# Patient Record
Sex: Female | Born: 1955 | Race: White | Marital: Married | Smoking: Former smoker
Health system: Southern US, Community
[De-identification: ages and names within clinical notes are randomized; demographics above are authoritative.]

## PROBLEM LIST (undated history)

## (undated) DIAGNOSIS — Z9289 Personal history of other medical treatment: Secondary | ICD-10-CM

## (undated) DIAGNOSIS — I447 Left bundle-branch block, unspecified: Secondary | ICD-10-CM

## (undated) DIAGNOSIS — M1990 Unspecified osteoarthritis, unspecified site: Secondary | ICD-10-CM

## (undated) DIAGNOSIS — Z9889 Other specified postprocedural states: Secondary | ICD-10-CM

## (undated) DIAGNOSIS — R112 Nausea with vomiting, unspecified: Secondary | ICD-10-CM

## (undated) DIAGNOSIS — R7303 Prediabetes: Secondary | ICD-10-CM

## (undated) DIAGNOSIS — R9431 Abnormal electrocardiogram [ECG] [EKG]: Secondary | ICD-10-CM

## (undated) HISTORY — PX: ABDOMINAL HYSTERECTOMY: SHX81

## (undated) HISTORY — PX: TUBAL LIGATION: SHX77

## (undated) HISTORY — PX: CARDIAC CATHETERIZATION: SHX172

## (undated) HISTORY — PX: JOINT REPLACEMENT: SHX530

## (undated) HISTORY — PX: DILATION AND CURETTAGE OF UTERUS: SHX78

## (undated) HISTORY — PX: FRACTURE SURGERY: SHX138

---

## 2018-11-26 ENCOUNTER — Other Ambulatory Visit: Payer: Self-pay | Admitting: Orthopedic Surgery

## 2018-11-26 DIAGNOSIS — M19171 Post-traumatic osteoarthritis, right ankle and foot: Secondary | ICD-10-CM

## 2018-11-28 ENCOUNTER — Other Ambulatory Visit: Payer: Self-pay

## 2018-11-28 ENCOUNTER — Ambulatory Visit
Admission: RE | Admit: 2018-11-28 | Discharge: 2018-11-28 | Disposition: A | Discharge status: Home / Self Care | Payer: Medicare Other | Source: Ambulatory Visit | Attending: Orthopedic Surgery | Admitting: Orthopedic Surgery

## 2018-11-28 ENCOUNTER — Other Ambulatory Visit: Payer: Self-pay | Admitting: Orthopedic Surgery

## 2018-11-28 DIAGNOSIS — M19171 Post-traumatic osteoarthritis, right ankle and foot: Secondary | ICD-10-CM

## 2018-12-05 ENCOUNTER — Other Ambulatory Visit: Payer: Self-pay

## 2018-12-23 ENCOUNTER — Other Ambulatory Visit (HOSPITAL_COMMUNITY): Payer: Self-pay | Admitting: Orthopedic Surgery

## 2019-01-08 NOTE — Pre-Procedure Instructions (Signed)
   Vanessa Russo  01/08/2019    [facility] [address] [place], [place] [address] [place] [address] Phone: [phone] Fax: [phone]   Your procedure is scheduled on Thursday, January 15, 2019  Report to [facility] at 5:30 A.M.  Call this number if you have problems the morning of surgery:  [phone]   Remember:  Do not eat  after midnight the night before surgery.  You may drink clear liquids until 4:30 A.M.  Clear liquids allowed are:                    Water, Juice (non-citric and without pulp), Carbonated beverages, Clear Tea, Black Coffee only and Gatorade Please finish your Ensure Pre-Surgery drink by 4:30A.M. the morning of surgery        ( Do not sip).   Take these medicines the morning of surgery with A SIP OF WATER:  carvedilol (COREG) omeprazole (PRILOSEC)  If needed: traMADol (ULTRAM) for pain  Stop taking Aspirin (unless otherwise advised by surgeon), vitamins, fish oil, TURMERIC and herbal medications. Do not take any NSAIDs ie: Ibuprofen, Advil, Naproxen (Aleve), Motrin, Meloxicam (Mobic),  BC and Goody Powder; stop now.   Do not wear jewelry, make-up or nail polish.  Do not wear lotions, powders, or perfumes, or deodorant.  Do not shave 48 hours prior to surgery.    Do not bring valuables to the hospital.  [facility] is not responsible for any belongings or valuables.  Contacts, dentures or bridgework may not be worn into surgery.  Leave your suitcase in the car.  After surgery it may be brought to your room.  For patients admitted to the hospital, discharge time will be determined by your treatment team.  Patients discharged the day of surgery will not be allowed to drive home.  Special instructions: See " [facility] Preparing For Surgery " sheet.  Please read over the following fact sheets that you were given. Pain Booklet, Coughing and Deep Breathing and Surgical Site  Infection Prevention

## 2019-01-12 ENCOUNTER — Encounter (HOSPITAL_COMMUNITY)
Admission: RE | Admit: 2019-01-12 | Discharge: 2019-01-12 | Disposition: A | Discharge status: Home / Self Care | Payer: Medicare Other | Source: Ambulatory Visit | Attending: Orthopedic Surgery | Admitting: Orthopedic Surgery

## 2019-01-12 ENCOUNTER — Other Ambulatory Visit (HOSPITAL_COMMUNITY)
Admission: RE | Admit: 2019-01-12 | Discharge: 2019-01-12 | Disposition: A | Discharge status: Home / Self Care | Payer: Medicare Other | Source: Ambulatory Visit | Attending: Orthopedic Surgery | Admitting: Orthopedic Surgery

## 2019-01-12 ENCOUNTER — Encounter (HOSPITAL_COMMUNITY): Payer: Self-pay

## 2019-01-12 ENCOUNTER — Other Ambulatory Visit: Payer: Self-pay

## 2019-01-12 DIAGNOSIS — T1490XS Injury, unspecified, sequela: Secondary | ICD-10-CM | POA: Insufficient documentation

## 2019-01-12 DIAGNOSIS — Z01818 Encounter for other preprocedural examination: Secondary | ICD-10-CM | POA: Insufficient documentation

## 2019-01-12 DIAGNOSIS — Z96652 Presence of left artificial knee joint: Secondary | ICD-10-CM | POA: Insufficient documentation

## 2019-01-12 DIAGNOSIS — R9431 Abnormal electrocardiogram [ECG] [EKG]: Secondary | ICD-10-CM | POA: Insufficient documentation

## 2019-01-12 DIAGNOSIS — Z79899 Other long term (current) drug therapy: Secondary | ICD-10-CM | POA: Insufficient documentation

## 2019-01-12 DIAGNOSIS — X58XXXS Exposure to other specified factors, sequela: Secondary | ICD-10-CM | POA: Insufficient documentation

## 2019-01-12 DIAGNOSIS — I447 Left bundle-branch block, unspecified: Secondary | ICD-10-CM | POA: Insufficient documentation

## 2019-01-12 DIAGNOSIS — M19171 Post-traumatic osteoarthritis, right ankle and foot: Secondary | ICD-10-CM | POA: Insufficient documentation

## 2019-01-12 DIAGNOSIS — Z87891 Personal history of nicotine dependence: Secondary | ICD-10-CM | POA: Insufficient documentation

## 2019-01-12 DIAGNOSIS — R7303 Prediabetes: Secondary | ICD-10-CM | POA: Insufficient documentation

## 2019-01-12 HISTORY — DX: Left bundle-branch block, unspecified: I44.7

## 2019-01-12 HISTORY — DX: Other specified postprocedural states: Z98.890

## 2019-01-12 HISTORY — DX: Personal history of other medical treatment: Z92.89

## 2019-01-12 HISTORY — DX: Prediabetes: R73.03

## 2019-01-12 HISTORY — DX: Unspecified osteoarthritis, unspecified site: M19.90

## 2019-01-12 HISTORY — DX: Abnormal electrocardiogram (ECG) (EKG): R94.31

## 2019-01-12 HISTORY — DX: Other specified postprocedural states: R11.2

## 2019-01-12 LAB — SURGICAL PCR SCREEN
MRSA, PCR: NEGATIVE
Staphylococcus aureus: NEGATIVE

## 2019-01-12 LAB — CBC
HCT: 44.1 % (ref 36.0–46.0)
Hemoglobin: 14.6 g/dL (ref 12.0–15.0)
MCH: 31.7 pg (ref 26.0–34.0)
MCHC: 33.1 g/dL (ref 30.0–36.0)
MCV: 95.7 fL (ref 80.0–100.0)
Platelets: 319 10*3/uL (ref 150–400)
RBC: 4.61 MIL/uL (ref 3.87–5.11)
RDW: 12.2 % (ref 11.5–15.5)
WBC: 8.7 10*3/uL (ref 4.0–10.5)
nRBC: 0 % (ref 0.0–0.2)

## 2019-01-12 LAB — BASIC METABOLIC PANEL
Anion gap: 12 (ref 5–15)
BUN: 17 mg/dL (ref 8–23)
CO2: 25 mmol/L (ref 22–32)
Calcium: 10.1 mg/dL (ref 8.9–10.3)
Chloride: 102 mmol/L (ref 98–111)
Creatinine, Ser: 0.69 mg/dL (ref 0.44–1.00)
GFR calc Af Amer: >60 mL/min (ref >60)
GFR calc non Af Amer: >60 mL/min (ref >60)
Glucose, Bld: 111 mg/dL — ABNORMAL HIGH (ref 70–99)
Potassium: 4.1 mmol/L (ref 3.5–5.1)
Sodium: 139 mmol/L (ref 135–145)

## 2019-01-12 LAB — HEMOGLOBIN A1C
Hgb A1c MFr Bld: 5.9 % — ABNORMAL HIGH (ref 4.8–5.6)
Mean Plasma Glucose: 122.63 mg/dL

## 2019-01-12 NOTE — Progress Notes (Signed)
PCP - Ephriam Jenkins, NP Cardiologist - Raechel Chute , MD   PPM/ICD - Denies  Chest x-ray - N/A EKG - 01/12/2019 Stress Test - 12/2018, per patient negative ECHO - 12/2016 Cardiac Cath - ~2000  Sleep Study - Denies  Patient denies being a diabetic  Blood Thinner Instructions: N/A Aspirin Instructions: N/A  ERAS Protcol - Yes PRE-SURGERY Ensure or G2- Ensure  COVID TEST- 01/12/2019   Anesthesia review: Yes, cardiac hx. Echo, stress test, and cardiac cath requested.  Patient denies shortness of breath, fever, cough and chest pain at PAT appointment   Coronavirus Screening  Have you experienced the following symptoms:  Cough yes/no: No Fever (>100.53F)  yes/no: No Runny nose yes/no: No Sore throat yes/no: No Difficulty breathing/shortness of breath  yes/no: No  Have you or a family member traveled in the last 14 days and where? yes/no: No   If the patient indicates "YES" to the above questions, their PAT will be rescheduled to limit the exposure to others and, the surgeon will be notified. THE PATIENT WILL NEED TO BE ASYMPTOMATIC FOR 14 DAYS.   If the patient is not experiencing any of these symptoms, the PAT nurse will instruct them to NOT bring anyone with them to their appointment since they may have these symptoms or traveled as well.   Please remind your patients and families that hospital visitation restrictions are in effect and the importance of the restrictions.   All instructions explained to the patient, with a verbal understanding of the material. Patient agrees to go over the instructions while at home for a better understanding. Patient also instructed to self quarantine after being tested for COVID-19. The opportunity to ask questions was provided.

## 2019-01-13 LAB — NOVEL CORONAVIRUS, NAA (HOSP ORDER, SEND-OUT TO REF LAB; TAT 18-24 HRS): SARS-CoV-2, NAA: NOT DETECTED

## 2019-01-13 NOTE — Progress Notes (Signed)
Anesthesia Chart Review:  Case: [id number] Date/Time: 01/15/19 0715   Procedure: Right total ankle replacement and Achilles tendon lengthening (Right Ankle) - 169min   Anesthesia type: General   Pre-op diagnosis: Post-traumatic osteoarthritis right ankle   Location: [facility] OR ROOM 06 / [facility]   Surgeons: Wylene Simmer, MD      DISCUSSION: Patient is a 63 year old female scheduled for the above procedure.  History includes former smoker (quit 1990), post-operative N/V, left BBB (diagnosed ~ 2007), pre-diabetes. BMI is consistent with morbid obesity.  She was felt to be "low risk" for planned procedure per cardiologist Dr. Darral Dash following recent non-ischemic stress test for DOE.    01/12/19 COVID-19 test was negative.  Based on currently available information, I would anticipate that she could proceed as planned if no acute changes.   VS: BP (!) 161/92   Pulse 75   Temp 36.8 C   Resp 18   Ht 5\' 3"  (1.6 m)   Wt 110.3 kg   SpO2 100%   BMI 43.06 kg/m    PROVIDERS: Ephriam Jenkins E his PCP Raechel Chute, MD is cardiologist ([facility], [facility])   LABS: Labs reviewed: Acceptable for surgery. (all labs ordered are listed, but only abnormal results are displayed)  Labs Reviewed  HEMOGLOBIN A1C - Abnormal; Notable for the following components:      Result Value   Hgb A1c MFr Bld 5.9 (*)    All other components within normal limits  BASIC METABOLIC PANEL - Abnormal; Notable for the following components:   Glucose, Bld 111 (*)    All other components within normal limits  SURGICAL PCR SCREEN  CBC     IMAGES: CT 3D Right ankle 11/28/18: IMPRESSION: 1. Severe tibiotalar, subtalar, talonavicular and calcaneocuboid degenerative changes as detailed above. 2. Moderate to advanced degenerative changes at cuboid articulation with the bases of the fourth and fifth metatarsals. 3. No acute bony findings or evidence of AVN. 4. Grossly by CT the medial, lateral and anterior  ankle tendons are intact and the mediolateral ligaments are intact.   EKG: 01/12/19: Normal sinus rhythm Left bundle branch block Abnormal ECG Confirmed by Loralie Champagne ([id number]) on 01/13/2019 1:25:02 AM - Known LBBB since ~ 2007 per cardiology notes.    CV: Nuclear stress test 12/17/18 ([facility]): Impression: 1.  No induced ischemia or abnormal flow reserve with vasodilator stress. 2.  Findings consistent with left bundle branch block. 3.  Preserved LV systolic function at rest. LVEF 59%. 4.  Low risk for perioperative cardiac complications from upcoming noncardiac surgery. Raechel Chute, MD)   According to 12/17/18 office note by Dr. Darral Dash: "TTE 01/06/2016: EF 55% (BPS 53%), PSM (LBBB), normal wall thickness, marked LV relaxation delay with suboptimal LV filling at 80 bpm, normal RV fx and RAP, normal valves, RVSP not estimated, LVFP probably normal, LAVi 20"  Reported prior cardiac cath, although not mentioned in 12/17/18 office note by Dr. Darral Dash. He does mention echo and stress test in 2007 around the time of her LBBB diagnosis.     Past Medical History:  Diagnosis Date  . Abnormal ECG during exercise stress test   . Arthritis   . History of echocardiogram   . Left bundle branch block   . PONV (postoperative nausea and vomiting)   . Pre-diabetes     Past Surgical History:  Procedure Laterality Date  . ABDOMINAL HYSTERECTOMY    . CARDIAC CATHETERIZATION    . DILATION AND CURETTAGE OF UTERUS    .  FRACTURE SURGERY     Bilateral ankles  . JOINT REPLACEMENT Left    knee  . TUBAL LIGATION      MEDICATIONS: . atorvastatin (LIPITOR) 40 MG tablet  . carvedilol (COREG) 6.25 MG tablet  . diphenhydramine-acetaminophen (TYLENOL PM) 25-500 MG TABS tablet  . losartan (COZAAR) 50 MG tablet  . meloxicam (MOBIC) 15 MG tablet  . montelukast (SINGULAIR) 10 MG tablet  . omeprazole (PRILOSEC) 20 MG capsule  . Probiotic Product (PROBIOTIC PO)  . traMADol (ULTRAM) 50 MG  tablet  . triamterene-hydrochlorothiazide (DYAZIDE) 37.5-25 MG capsule  . TURMERIC PO   No current facility-administered medications for this encounter.    Shonna Chock, PA-C Surgical Short Stay/Anesthesiology [facility] Phone [phone] [facility] Phone [phone] 01/13/2019 1:22 PM

## 2019-01-13 NOTE — Anesthesia Preprocedure Evaluation (Addendum)
Anesthesia Evaluation  Patient identified by MRN, date of birth, ID band Patient awake    Reviewed: Allergy & Precautions, NPO status , Patient's Chart, lab work & pertinent test results  History of Anesthesia Complications (+) PONV  Airway Mallampati: II  TM Distance: >3 FB Neck ROM: Full    Dental no notable dental hx. (+) Dental Advisory Given, Teeth Intact   Pulmonary neg pulmonary ROS, former smoker,    Pulmonary exam normal breath sounds clear to auscultation       Cardiovascular negative cardio ROS Normal cardiovascular exam Rhythm:Regular Rate:Normal     Neuro/Psych negative neurological ROS  negative psych ROS   GI/Hepatic negative GI ROS, Neg liver ROS,   Endo/Other  Morbid obesity  Renal/GU negative Renal ROS  negative genitourinary   Musculoskeletal  (+) Arthritis , Osteoarthritis,    Abdominal (+) + obese,   Peds negative pediatric ROS (+)  Hematology negative hematology ROS (+)   Anesthesia Other Findings   Reproductive/Obstetrics negative OB ROS                            Anesthesia Physical Anesthesia Plan  ASA: III  Anesthesia Plan: General   Post-op Pain Management:  Regional for Post-op pain   Induction: Intravenous  PONV Risk Score and Plan: 4 or greater and Ondansetron, Dexamethasone, Midazolam, Scopolamine patch - Pre-op and Treatment may vary due to age or medical condition  Airway Management Planned: LMA  Additional Equipment:   Intra-op Plan:   Post-operative Plan: Extubation in OR  Informed Consent: I have reviewed the patients History and Physical, chart, labs and discussed the procedure including the risks, benefits and alternatives for the proposed anesthesia with the patient or authorized representative who has indicated his/her understanding and acceptance.     Dental advisory given  Plan Discussed with: CRNA  Anesthesia Plan Comments:  (PAT note written 01/13/2019 by Myra Gianotti, PA-C. )       Anesthesia Quick Evaluation

## 2019-01-15 ENCOUNTER — Inpatient Hospital Stay (HOSPITAL_COMMUNITY): Payer: Medicare Other | Admitting: Anesthesiology

## 2019-01-15 ENCOUNTER — Encounter (HOSPITAL_COMMUNITY)
Admission: RE | Disposition: A | Discharge status: Home / Self Care | Payer: Self-pay | Source: Home / Self Care | Attending: Orthopedic Surgery

## 2019-01-15 ENCOUNTER — Encounter (HOSPITAL_COMMUNITY): Payer: Self-pay | Admitting: Orthopedic Surgery

## 2019-01-15 ENCOUNTER — Other Ambulatory Visit: Payer: Self-pay

## 2019-01-15 ENCOUNTER — Inpatient Hospital Stay (HOSPITAL_COMMUNITY)
Admission: RE | Admit: 2019-01-15 | Discharge: 2019-01-15 | DRG: 469 | Disposition: A | Discharge status: Home / Self Care | Payer: Medicare Other | Attending: Orthopedic Surgery | Admitting: Orthopedic Surgery

## 2019-01-15 ENCOUNTER — Inpatient Hospital Stay (HOSPITAL_COMMUNITY): Payer: Medicare Other | Admitting: Vascular Surgery

## 2019-01-15 ENCOUNTER — Inpatient Hospital Stay (HOSPITAL_COMMUNITY): Payer: Medicare Other

## 2019-01-15 DIAGNOSIS — Z79899 Other long term (current) drug therapy: Secondary | ICD-10-CM | POA: Diagnosis not present

## 2019-01-15 DIAGNOSIS — Z20828 Contact with and (suspected) exposure to other viral communicable diseases: Secondary | ICD-10-CM | POA: Diagnosis present

## 2019-01-15 DIAGNOSIS — Z881 Allergy status to other antibiotic agents status: Secondary | ICD-10-CM | POA: Diagnosis not present

## 2019-01-15 DIAGNOSIS — M19171 Post-traumatic osteoarthritis, right ankle and foot: Principal | ICD-10-CM | POA: Diagnosis present

## 2019-01-15 DIAGNOSIS — Z87891 Personal history of nicotine dependence: Secondary | ICD-10-CM | POA: Diagnosis not present

## 2019-01-15 DIAGNOSIS — Z884 Allergy status to anesthetic agent status: Secondary | ICD-10-CM

## 2019-01-15 DIAGNOSIS — Z880 Allergy status to penicillin: Secondary | ICD-10-CM | POA: Diagnosis not present

## 2019-01-15 DIAGNOSIS — Z882 Allergy status to sulfonamides status: Secondary | ICD-10-CM | POA: Diagnosis not present

## 2019-01-15 DIAGNOSIS — M6701 Short Achilles tendon (acquired), right ankle: Secondary | ICD-10-CM | POA: Diagnosis present

## 2019-01-15 DIAGNOSIS — Z419 Encounter for procedure for purposes other than remedying health state, unspecified: Secondary | ICD-10-CM

## 2019-01-15 DIAGNOSIS — Z791 Long term (current) use of non-steroidal anti-inflammatories (NSAID): Secondary | ICD-10-CM

## 2019-01-15 DIAGNOSIS — M25771 Osteophyte, right ankle: Secondary | ICD-10-CM | POA: Diagnosis present

## 2019-01-15 HISTORY — PX: TOTAL ANKLE ARTHROPLASTY: SHX811

## 2019-01-15 LAB — GLUCOSE, CAPILLARY: Glucose-Capillary: 96 mg/dL (ref 70–99)

## 2019-01-15 SURGERY — ARTHROPLASTY, ANKLE, TOTAL
Anesthesia: General | Site: Ankle | Laterality: Right

## 2019-01-15 MED ORDER — PROPOFOL 10 MG/ML IV BOLUS
INTRAVENOUS | Status: DC | PRN
  Administered 2019-01-15: 200 mg via INTRAVENOUS

## 2019-01-15 MED ORDER — LIDOCAINE 2% (20 MG/ML) 5 ML SYRINGE
INTRAMUSCULAR | Status: DC | PRN
  Administered 2019-01-15: 60 mg via INTRAVENOUS

## 2019-01-15 MED ORDER — LIDOCAINE 2% (20 MG/ML) 5 ML SYRINGE
INTRAMUSCULAR | Status: AC
  Filled 2019-01-15: qty 5

## 2019-01-15 MED ORDER — ASPIRIN EC 81 MG PO TBEC: 81.0000 mg | DELAYED_RELEASE_TABLET | Freq: Two times a day (BID) | ORAL | 0 refills | Status: AC

## 2019-01-15 MED ORDER — OXYCODONE HCL 5 MG PO TABS: 5.0000 mg | ORAL_TABLET | ORAL | 0 refills | Status: AC | PRN

## 2019-01-15 MED ORDER — VANCOMYCIN HCL 500 MG IV SOLR
INTRAVENOUS | Status: DC | PRN
  Administered 2019-01-15: 500 mg via TOPICAL

## 2019-01-15 MED ORDER — SCOPOLAMINE 1 MG/3DAYS TD PT72
MEDICATED_PATCH | TRANSDERMAL | Status: DC | PRN
  Administered 2019-01-15: 1 via TRANSDERMAL

## 2019-01-15 MED ORDER — LACTATED RINGERS IV SOLN: INTRAVENOUS | Status: DC | PRN

## 2019-01-15 MED ORDER — ROPIVACAINE HCL 5 MG/ML IJ SOLN
INTRAMUSCULAR | Status: DC | PRN
  Administered 2019-01-15: 50 mL via PERINEURAL

## 2019-01-15 MED ORDER — PROPOFOL 10 MG/ML IV BOLUS
INTRAVENOUS | Status: AC
  Filled 2019-01-15: qty 20

## 2019-01-15 MED ORDER — OXYCODONE HCL 5 MG/5ML PO SOLN: 5.0000 mg | Freq: Once | ORAL | Status: AC | PRN

## 2019-01-15 MED ORDER — DEXAMETHASONE SODIUM PHOSPHATE 10 MG/ML IJ SOLN
INTRAMUSCULAR | Status: DC | PRN
  Administered 2019-01-15: 10 mg via INTRAVENOUS

## 2019-01-15 MED ORDER — ONDANSETRON HCL 4 MG/2ML IJ SOLN
INTRAMUSCULAR | Status: DC | PRN
  Administered 2019-01-15: 4 mg via INTRAVENOUS

## 2019-01-15 MED ORDER — SENNA 8.6 MG PO TABS: 2.0000 | ORAL_TABLET | Freq: Two times a day (BID) | ORAL | 0 refills | Status: AC

## 2019-01-15 MED ORDER — PHENYLEPHRINE HCL-NACL 10-0.9 MG/250ML-% IV SOLN
INTRAVENOUS | Status: DC | PRN
  Administered 2019-01-15: 25 ug/min via INTRAVENOUS

## 2019-01-15 MED ORDER — ONDANSETRON HCL 4 MG/2ML IJ SOLN
INTRAMUSCULAR | Status: AC
  Filled 2019-01-15: qty 2

## 2019-01-15 MED ORDER — VANCOMYCIN HCL 500 MG IV SOLR
INTRAVENOUS | Status: AC
  Filled 2019-01-15: qty 500

## 2019-01-15 MED ORDER — DOCUSATE SODIUM 100 MG PO CAPS: 100.0000 mg | ORAL_CAPSULE | Freq: Every day | ORAL | 2 refills | Status: AC | PRN

## 2019-01-15 MED ORDER — PHENYLEPHRINE 40 MCG/ML (10ML) SYRINGE FOR IV PUSH (FOR BLOOD PRESSURE SUPPORT)
PREFILLED_SYRINGE | INTRAVENOUS | Status: DC | PRN
  Administered 2019-01-15: 120 ug via INTRAVENOUS
  Administered 2019-01-15 (×3): 80 ug via INTRAVENOUS

## 2019-01-15 MED ORDER — 0.9 % SODIUM CHLORIDE (POUR BTL) OPTIME
TOPICAL | Status: DC | PRN
  Administered 2019-01-15: 1000 mL

## 2019-01-15 MED ORDER — CEFAZOLIN SODIUM-DEXTROSE 2-4 GM/100ML-% IV SOLN
2.0000 g | INTRAVENOUS | Status: AC
  Administered 2019-01-15: 2 g via INTRAVENOUS
  Filled 2019-01-15: qty 100

## 2019-01-15 MED ORDER — MIDAZOLAM HCL 2 MG/2ML IJ SOLN
INTRAMUSCULAR | Status: AC
  Filled 2019-01-15: qty 2

## 2019-01-15 MED ORDER — HYDROMORPHONE HCL 1 MG/ML IJ SOLN
INTRAMUSCULAR | Status: AC
  Filled 2019-01-15: qty 1

## 2019-01-15 MED ORDER — DEXAMETHASONE SODIUM PHOSPHATE 10 MG/ML IJ SOLN
INTRAMUSCULAR | Status: AC
  Filled 2019-01-15: qty 1

## 2019-01-15 MED ORDER — HYDROMORPHONE HCL 1 MG/ML IJ SOLN
0.2500 mg | INTRAMUSCULAR | Status: DC | PRN
  Administered 2019-01-15 (×2): 0.5 mg via INTRAVENOUS

## 2019-01-15 MED ORDER — FENTANYL CITRATE (PF) 250 MCG/5ML IJ SOLN
INTRAMUSCULAR | Status: AC
  Filled 2019-01-15: qty 5

## 2019-01-15 MED ORDER — CHLORHEXIDINE GLUCONATE 4 % EX LIQD: 60.0000 mL | Freq: Once | CUTANEOUS | Status: DC

## 2019-01-15 MED ORDER — SODIUM CHLORIDE 0.9 % IV SOLN: INTRAVENOUS | Status: DC

## 2019-01-15 MED ORDER — MEPERIDINE HCL 25 MG/ML IJ SOLN: 6.2500 mg | INTRAMUSCULAR | Status: DC | PRN

## 2019-01-15 MED ORDER — PHENYLEPHRINE 40 MCG/ML (10ML) SYRINGE FOR IV PUSH (FOR BLOOD PRESSURE SUPPORT)
PREFILLED_SYRINGE | INTRAVENOUS | Status: AC
  Filled 2019-01-15: qty 10

## 2019-01-15 MED ORDER — PROMETHAZINE HCL 25 MG/ML IJ SOLN: 6.2500 mg | INTRAMUSCULAR | Status: DC | PRN

## 2019-01-15 MED ORDER — OXYCODONE HCL 5 MG PO TABS
ORAL_TABLET | ORAL | Status: AC
  Filled 2019-01-15: qty 1

## 2019-01-15 MED ORDER — MIDAZOLAM HCL 5 MG/5ML IJ SOLN
INTRAMUSCULAR | Status: DC | PRN
  Administered 2019-01-15: 2 mg via INTRAVENOUS

## 2019-01-15 MED ORDER — OXYCODONE HCL 5 MG PO TABS
5.0000 mg | ORAL_TABLET | Freq: Once | ORAL | Status: AC | PRN
  Administered 2019-01-15: 5 mg via ORAL

## 2019-01-15 MED ORDER — FENTANYL CITRATE (PF) 100 MCG/2ML IJ SOLN
INTRAMUSCULAR | Status: DC | PRN
  Administered 2019-01-15 (×2): 50 ug via INTRAVENOUS

## 2019-01-15 SURGICAL SUPPLY — 66 items
BANDAGE ESMARK 6X9 LF (GAUZE/BANDAGES/DRESSINGS) IMPLANT
BLADE KM SAW (BLADE) ×2 IMPLANT
BLADE RECIPRO TAPERED (BLADE) ×2 IMPLANT
BLADE SURG 15 STRL LF DISP TIS (BLADE) ×2 IMPLANT
BLADE SURG 15 STRL SS (BLADE) ×2
BNDG ESMARK 6X9 LF (GAUZE/BANDAGES/DRESSINGS)
CHLORAPREP W/TINT 26 (MISCELLANEOUS) ×2 IMPLANT
CLIP LOCKING VANTAGE SZ 2 (Clip) ×2 IMPLANT
COVER SURGICAL LIGHT HANDLE (MISCELLANEOUS) ×2 IMPLANT
COVER WAND RF STERILE (DRAPES) IMPLANT
CUFF TOURN SGL QUICK 34 (TOURNIQUET CUFF) ×1
CUFF TRNQT CYL 34X4.125X (TOURNIQUET CUFF) ×1 IMPLANT
DRAPE C-ARM 42X72 X-RAY (DRAPES) ×2 IMPLANT
DRAPE C-ARMOR (DRAPES) ×2 IMPLANT
DRAPE U-SHAPE 47X51 STRL (DRAPES) ×2 IMPLANT
DRSG MEPILEX BORDER 4X4 (GAUZE/BANDAGES/DRESSINGS) ×2 IMPLANT
DRSG MEPITEL 4X7.2 (GAUZE/BANDAGES/DRESSINGS) ×2 IMPLANT
DRSG PAD ABDOMINAL 8X10 ST (GAUZE/BANDAGES/DRESSINGS) ×4 IMPLANT
ELECT REM PT RETURN 9FT ADLT (ELECTROSURGICAL) ×2
ELECTRODE REM PT RTRN 9FT ADLT (ELECTROSURGICAL) ×1 IMPLANT
FACESHIELD WRAPAROUND (MASK) ×2 IMPLANT
GAUZE SPONGE 4X4 12PLY STRL (GAUZE/BANDAGES/DRESSINGS) ×4 IMPLANT
GAUZE SPONGE 4X4 12PLY STRL LF (GAUZE/BANDAGES/DRESSINGS) ×2 IMPLANT
GLOVE BIO SURGEON STRL SZ8 (GLOVE) ×2 IMPLANT
GLOVE BIOGEL PI IND STRL 8 (GLOVE) ×2 IMPLANT
GLOVE BIOGEL PI IND STRL 8.5 (GLOVE) ×1 IMPLANT
GLOVE BIOGEL PI INDICATOR 8 (GLOVE) ×2
GLOVE BIOGEL PI INDICATOR 8.5 (GLOVE) ×1
GLOVE ECLIPSE 8.0 STRL XLNG CF (GLOVE) ×4 IMPLANT
GLOVE SURG SS PI 6.5 STRL IVOR (GLOVE) ×4 IMPLANT
GOWN STRL REUS W/ TWL LRG LVL3 (GOWN DISPOSABLE) ×2 IMPLANT
GOWN STRL REUS W/ TWL XL LVL3 (GOWN DISPOSABLE) ×2 IMPLANT
GOWN STRL REUS W/TWL LRG LVL3 (GOWN DISPOSABLE) ×2
GOWN STRL REUS W/TWL XL LVL3 (GOWN DISPOSABLE) ×2
IMPL TALAR SZ1 RT (Ankle) ×1 IMPLANT
IMPLANT TALAR SZ1 RT (Ankle) ×2 IMPLANT
INSERT TIB FB SZ1 RT (Insert) ×1 IMPLANT
INSERT TIB THK7X1 STRL LF ANKL (Insert) ×1 IMPLANT
INSRT TIB THK7X1 STRL LF ANKL (Insert) ×1 IMPLANT
KIT BASIN OR (CUSTOM PROCEDURE TRAY) ×2 IMPLANT
KIT TURNOVER KIT B (KITS) ×2 IMPLANT
NS IRRIG 1000ML POUR BTL (IV SOLUTION) ×2 IMPLANT
PACK ORTHO EXTREMITY (CUSTOM PROCEDURE TRAY) ×2 IMPLANT
PAD ABD 8X10 STRL (GAUZE/BANDAGES/DRESSINGS) ×2 IMPLANT
PAD ARMBOARD 7.5X6 YLW CONV (MISCELLANEOUS) ×4 IMPLANT
PAD CAST 4YDX4 CTTN HI CHSV (CAST SUPPLIES) ×1 IMPLANT
PADDING CAST COTTON 4X4 STRL (CAST SUPPLIES) ×1
PADDING CAST COTTON 6X4 STRL (CAST SUPPLIES) ×2 IMPLANT
PIN POUCH TALAR VANTAGE 2.0 (PIN) ×2 IMPLANT
PIN POUCH TALAR VANTAGE 2.5 (PIN) ×2 IMPLANT
PIN POUCH TALAR VANTAGE 3.5 (PIN) ×4 IMPLANT
PLATE TIB BEARING VT SZ 2 RT (Plate) ×2 IMPLANT
POUCH SCREW TALAR ANKLE (ORTHOPEDIC DISPOSABLE SUPPLIES) ×2 IMPLANT
SAW STRYKER ANKLE 10X75X1.19 (BLADE) ×2 IMPLANT
STOCKINETTE SYNTHETIC 6 UNSTER (CAST SUPPLIES) ×2 IMPLANT
SUCTION FRAZIER HANDLE 10FR (MISCELLANEOUS) ×1
SUCTION TUBE FRAZIER 10FR DISP (MISCELLANEOUS) ×1 IMPLANT
SURGILUBE 2OZ TUBE FLIPTOP (MISCELLANEOUS) ×2 IMPLANT
SUT ETHILON 3 0 PS 1 (SUTURE) ×4 IMPLANT
SUT MNCRL AB 3-0 PS2 18 (SUTURE) ×4 IMPLANT
SUT VIC AB 0 CT1 27 (SUTURE) ×2
SUT VIC AB 0 CT1 27XBRD ANBCTR (SUTURE) ×2 IMPLANT
TOWEL GREEN STERILE (TOWEL DISPOSABLE) ×2 IMPLANT
TOWEL GREEN STERILE FF (TOWEL DISPOSABLE) ×2 IMPLANT
TUBE CONNECTING 12X1/4 (SUCTIONS) ×2 IMPLANT
YANKAUER SUCT BULB TIP NO VENT (SUCTIONS) ×2 IMPLANT

## 2019-01-15 NOTE — Anesthesia Postprocedure Evaluation (Signed)
Anesthesia Post Note  Patient: Vanessa Russo  Procedure(s) Performed: Right total ankle replacement and Achilles tendon lengthening (Right Ankle)     Patient location during evaluation: PACU Anesthesia Type: General Level of consciousness: awake and alert Pain management: pain level controlled Vital Signs Assessment: post-procedure vital signs reviewed and stable Respiratory status: spontaneous breathing, nonlabored ventilation and respiratory function stable Cardiovascular status: blood pressure returned to baseline and stable Postop Assessment: no apparent nausea or vomiting Anesthetic complications: no    Last Vitals:  Vitals:   01/15/19 1015 01/15/19 1045  BP: 133/77 124/74  Pulse: 75 76  Resp: 14 15  Temp: 36.5 C   SpO2: 94% 95%    Last Pain:  Vitals:   01/15/19 1013  TempSrc:   PainSc: [place]

## 2019-01-15 NOTE — Anesthesia Procedure Notes (Signed)
Anesthesia Regional Block: Adductor canal block   Pre-Anesthetic Checklist: ,, timeout performed, Correct Patient, Correct Site, Correct Laterality, Correct Procedure, Correct Position, site marked, Risks and benefits discussed,  Surgical consent,  Pre-op evaluation,  At surgeon's request and post-op pain management  Laterality: Right  Prep: chloraprep       Needles:  Injection technique: Single-shot  Needle Type: Stimiplex     Needle Length: 9cm  Needle Gauge: 21     Additional Needles:   Narrative:  Start time: 01/15/2019 7:12 AM End time: 01/15/2019 7:17 AM Injection made incrementally with aspirations every 5 mL.  Performed by: Personally  Anesthesiologist: Lynda Rainwater, MD

## 2019-01-15 NOTE — Anesthesia Procedure Notes (Addendum)
Anesthesia Regional Block: Popliteal block   Pre-Anesthetic Checklist: ,, timeout performed, Correct Patient, Correct Site, Correct Laterality, Correct Procedure, Correct Position, site marked, Risks and benefits discussed,  Surgical consent,  Pre-op evaluation,  At surgeon's request and post-op pain management  Laterality: Right  Prep: chloraprep       Needles:  Injection technique: Single-shot  Needle Type: Stimiplex     Needle Length: 9cm  Needle Gauge: 21     Additional Needles:   Procedures:,,,, ultrasound used (permanent image in chart),,,,  Narrative:  Start time: 01/15/2019 7:13 AM End time: 01/15/2019 7:18 AM Injection made incrementally with aspirations every 5 mL.  Performed by: Personally  Anesthesiologist: Lynda Rainwater, MD

## 2019-01-15 NOTE — Op Note (Signed)
01/15/2019  10:04 AM  PATIENT:  Vanessa Russo  63 y.o. female  PRE-OPERATIVE DIAGNOSIS:  Post-traumatic osteoarthritis right ankle  POST-OPERATIVE DIAGNOSIS: 1.  Post-traumatic osteoarthritis right ankle      2.  Short achilles right ankle   Procedure(s): 1.  Right total ankle replacement   2.  Percutaneous right achilles tendon lengthening.  SURGEON:  Toni Arthurs, MD  ASSISTANT: Alfredo Martinez, PA-C  ANESTHESIA:   General, regional  EBL:  minimal   TOURNIQUET:   Total Tourniquet Time Documented: Thigh (Right) - 100 minutes Total: Thigh (Right) - 100 minutes  COMPLICATIONS:  None apparent  DISPOSITION:  Extubated, awake and stable to recovery.  INDICATION FOR PROCEDURE: The patient is a 63 year old female with a long history of right ankle pain due to posttraumatic arthritis.  She has failed nonoperative treatment to date including activity modification, oral anti-inflammatories, shoewear modification and bracing.  She presents now for right total ankle replacement.  The risks and benefits of the alternative treatment options have been discussed in detail.  The patient wishes to proceed with surgery and specifically understands risks of bleeding, infection, nerve damage, blood clots, need for additional surgery, amputation and death.  PROCEDURE IN DETAIL:  After pre operative consent was obtained, and the correct operative site was identified, the patient was brought to the operating room and placed supine on the OR table.  Anesthesia was administered.  Pre-operative antibiotics were administered.  A surgical timeout was taken.  The right lower extremity was prepped and draped in standard sterile fashion with a tourniquet around the thigh.  The extremity was elevated and the tourniquet was inflated to 350 mmHg.  An anterior incision was then made just lateral to the tibial crest.  Dissection was carried down through the subcutaneous tissues.  The extensor retinaculum was incised  over the extensor houses longus tendon.  The interval between the EHL and tibialis anterior was developed.  The neurovascular bundle was identified.  It was mobilized and retracted laterally.  Was protected throughout the case.  The anterior joint capsule was incised and elevated medially and laterally.  The patient was noted to have end-stage arthritis of the tibiotalar joint with prominent anterior osteophytes and varus malalignment..  The anterior osteophytes were resected with a reciprocating saw, osteotome and rongeurs.  The deep deltoid ligament fibers were released with subperiosteal dissection back to the posterior tibial tendon.  A guidepin was then inserted at the midline of the tibial tubercle in line with a marker in the medial gutter.  The external alignment guide was then applied and positioned adjacent to the medial gutter distally.  An AP radiograph confirmed appropriate alignment with the tibial shaft.  Rotation was then set in line with the marker in the medial gutter.  A lateral radiograph was then obtained.  The appropriate resection height was set with the angel wing taking an extra millimeter due to the patient's significant tightness posteriorly.  The cutting block was then adjusted in the medial lateral direction and pinned in line with the medial gutter.  The distal tibial cut was made and cut bone removed.  The talar guide was then inserted and tensioned appropriately against the talar dome.  A lateral radiograph was obtained again with the angel wing confirming appropriate alignment of the talar dome cut.  The cut was made with the oscillating saw and waste bone removed.  The talar sizing lollipop was inserted and a size 1 talar component selected.  The lollipop was provisionally pinned  and then removed.  The talar cutting block was inserted flush on the talar dome.  The posterior cut was made followed by the anterior two milling slots.  The pins were removed and the anterior cut was made.   The cutting block was removed.  The talar dome was then smoothed with a rasp.  The tibia was measured as a size 2.  The tibial trial was inserted and pinned.  The talar trial was inserted.  Rotation was set and the trial was pinned.  The 2 lug holes were drilled.  The lateral radiograph confirmed appropriate size and position of the tibial trial.  The 3 peripheral punch holes were made followed by the central punch hole.  The trials were then removed.  The wound was irrigated copiously.  Vancomycin powder was sprinkled on the backside of the talar implant and it was inserted and impacted in position.  Vancomycin powder was again applied to the backside of the talar implant.  It was inserted and impacted into position.  The polyethylene trial was inserted, and the ankle was noted to still be quite tight.  The heel cord was then lengthened using a triple hemisection percutaneous technique.  This allowed the ankle to dorsiflex approximately 15 degrees past neutral.  A 7 mm polyethylene implant was inserted followed by the locking clip.  Final AP, lateral and mortise radiographs of the ankle showed appropriate position of all hardware.  The wound was irrigated copiously.  Vancomycin powder was sprinkled into the deep portion of the wound.  The anterior capsule was repaired with 0 Vicryl.  The extensor retinaculum was repaired with 0 Vicryl.  Subcutaneous tissues were approximated with 3-0 Monocryl, and the skin incision was closed with 3-0 nylon.  Sterile dressings were applied followed by a well-padded short leg cast.  The tourniquet was released after application of the dressings.  The patient was awakened from anesthesia and transported to the recovery room in stable condition.  FOLLOW UP PLAN: The patient will be admitted for this inpatient only procedure.  She will be nonweightbearing on the right lower extremity.  Aspirin for DVT prophylaxis.  If she is doing well we may consider discharge from the recovery  room.     Mechele Claude PA-C was present and scrubbed for the duration of the operative case. His assistance was essential in positioning the patient, prepping and draping, gaining and maintaining exposure, performing the operation, closing and dressing the wounds and applying the splint.

## 2019-01-15 NOTE — Discharge Summary (Signed)
Physician Discharge Summary  Patient ID: Vanessa Russo MRN: [id number] DOB/AGE: 06-26-1955 63 y.o.  Admit date: 01/15/2019 Discharge date: 01/15/2019  Admission Diagnoses: Right ankle osteoarthritis; hx of abnormal ECG, left bundle branch block, PONV, and pre-diabetes.  Discharge Diagnoses:  Active Problems:   * No active hospital problems. * same as above  Discharged Condition: stable  Hospital Course: Patient presented to [facility] on 01/15/19 for elective Right total ankle replacement by Dr. Wylene Simmer.  The patient tolerated the procedure well without complication.  She was then admitted to the hospital.  She did better than expected in PACU and was cleared by PT prior to surgery.  She is to be D/C'd home on 01/15/19.  She tolerated her stay at the hospital well without complication.  Consults: N/A  Significant Diagnostic Studies: radiology: X-Ray: to ensure satisfactory anatomic alignment during operative procedure.  Treatments: IV hydration, antibiotics: Ancef, vancomyosin powder, analgesia: acetaminophen, Vicodin and Dilaudid, anticoagulation: lovenox and surgery: as stated above.  Discharge Exam: Blood pressure 138/83, pulse 70, temperature 97.7 F (36.5 C), resp. rate 17, height 5\' 3"  (1.6 m), weight 108.9 kg, SpO2 93 %. General: WDWN patient in NAD. Psych:  Appropriate mood and affect. Neuro:  A&O x 3, Moving all extremities, sensation intact to light touch HEENT:  EOMs intact Chest:  Even non-labored respirations Skin:  SLC C/D/I, no rashes or lesions Extremities: warm/dry, no visible edema, erythema or echymosis.  No lymphadenopathy. Pulses: Popliteus 2+ MSK:  ROM: EHL/FHL intact, MMT: able to perform quad set  Disposition: Discharge disposition: 01-Home or Self Care       Discharge Instructions    Call MD / Call 911   Complete by: As directed    If you experience chest pain or shortness of breath, CALL 911 and be transported to the hospital  emergency room.  If you develope a fever above 101 F, pus (white drainage) or increased drainage or redness at the wound, or calf pain, call your surgeon's office.   Call MD / Call 911   Complete by: As directed    If you experience chest pain or shortness of breath, CALL 911 and be transported to the hospital emergency room.  If you develope a fever above 101 F, pus (white drainage) or increased drainage or redness at the wound, or calf pain, call your surgeon's office.   Constipation Prevention   Complete by: As directed    Drink plenty of fluids.  Prune juice may be helpful.  You may use a stool softener, such as Colace (over the counter) 100 mg twice a day.  Use MiraLax (over the counter) for constipation as needed.   Constipation Prevention   Complete by: As directed    Drink plenty of fluids.  Prune juice may be helpful.  You may use a stool softener, such as Colace (over the counter) 100 mg twice a day.  Use MiraLax (over the counter) for constipation as needed.   Diet - low sodium heart healthy   Complete by: As directed    Increase activity slowly as tolerated   Complete by: As directed    Increase activity slowly as tolerated   Complete by: As directed    Non weight bearing   Complete by: As directed    Laterality: right   Extremity: Lower   Non weight bearing   Complete by: As directed    Laterality: right   Extremity: Lower     Allergies as of 01/15/2019  Reactions   Penicillins Hives, Swelling   Did it involve swelling of the face/tongue/throat, SOB, or low BP? Unknown Did it involve sudden or severe rash/hives, skin peeling, or any reaction on the inside of your mouth or nose? Yes Did you need to seek medical attention at a hospital or doctor's office? No When did it last happen?Childhood reaction. If all above answers are "NO", may proceed with cephalosporin use.   Other Nausea And Vomiting   Anesthesia-vomiting    Sulfa Antibiotics Nausea And Vomiting    Ciprofloxacin Nausea And Vomiting      Medication List    STOP taking these medications   traMADol 50 MG tablet Commonly known as: ULTRAM     TAKE these medications   aspirin EC 81 MG tablet Take 1 tablet (81 mg total) by mouth 2 (two) times daily.   atorvastatin 40 MG tablet Commonly known as: LIPITOR Take 40 mg by mouth every evening.   carvedilol 6.25 MG tablet Commonly known as: COREG Take 6.25 mg by mouth 2 (two) times daily.   diphenhydramine-acetaminophen 25-500 MG Tabs tablet Commonly known as: TYLENOL PM Take 2 tablets by mouth at bedtime as needed (sleep/pain).   docusate sodium 100 MG capsule Commonly known as: Colace Take 1 capsule (100 mg total) by mouth daily as needed.   losartan 50 MG tablet Commonly known as: COZAAR Take 50 mg by mouth every evening.   meloxicam 15 MG tablet Commonly known as: MOBIC Take 15 mg by mouth daily.   montelukast 10 MG tablet Commonly known as: SINGULAIR Take 10 mg by mouth at bedtime.   omeprazole 20 MG capsule Commonly known as: PRILOSEC Take 20 mg by mouth daily before breakfast.   oxyCODONE 5 MG immediate release tablet Commonly known as: Roxicodone Take 1 tablet (5 mg total) by mouth every 4 (four) hours as needed for up to 5 days for moderate pain or severe pain.   PROBIOTIC PO Take 1 capsule by mouth daily.   senna 8.6 MG Tabs tablet Commonly known as: SENOKOT Take 2 tablets (17.2 mg total) by mouth 2 (two) times daily.   triamterene-hydrochlorothiazide 37.5-25 MG capsule Commonly known as: DYAZIDE Take 1 capsule by mouth daily.   TURMERIC PO Take 1,950 mg by mouth 2 (two) times daily.            Discharge Care Instructions  (From admission, onward)         Start     Ordered   01/15/19 0000  Non weight bearing    Question Answer Comment  Laterality right   Extremity Lower      01/15/19 0949   01/15/19 0000  Non weight bearing    Question Answer Comment  Laterality right   Extremity  Lower      01/15/19 1005         Follow-up Information    Toni Arthurs, MD. Schedule an appointment as soon as possible for a visit in 3 weeks.   Specialty: Orthopedic Surgery Contact information: [address] [place] [address] [place] [address] [phone]           Signed: Lolly Mustache Office:  [phone]

## 2019-01-15 NOTE — Discharge Instructions (Addendum)
Vanessa Simmer, MD [facility]  Please read the following information regarding your care after surgery.  Medications  You only need a prescription for the narcotic pain medicine (ex. oxycodone, Percocet, Norco).  All of the other medicines listed below are available over the counter. X Meloxicam that you have at home, as prescribed, for the first 3 days after surgery. X acetominophen (Tylenol) 650 mg every 4-6 hours as you need for minor to moderate pain X oxycodone as prescribed for severe pain  Narcotic pain medicine (ex. oxycodone, Percocet, Vicodin) will cause constipation.  To prevent this problem, take the following medicines while you are taking any pain medicine. X docusate sodium (Colace) 100 mg twice a day X senna (Senokot) 2 tablets twice a day  X To help prevent blood clots, take a baby aspirin (81 mg) twice a day for three weeks after surgery.  You should also get up every hour while you are awake to move around.    Weight Bearing X Do not bear any weight on the operated leg or foot.  Cast / Splint / Dressing X Keep your splint, cast or dressing clean and dry.  Don't put anything (coat hanger, pencil, etc) down inside of it.  If it gets damp, use a hair dryer on the cool setting to dry it.  If it gets soaked, call the office to schedule an appointment for a cast change.   After your dressing, cast or splint is removed; you may shower, but do not soak or scrub the wound.  Allow the water to run over it, and then gently pat it dry.  Swelling It is normal for you to have swelling where you had surgery.  To reduce swelling and pain, keep your toes above your nose for at least 3 days after surgery.  It may be necessary to keep your foot or leg elevated for several weeks.  If it hurts, it should be elevated.  Follow Up Call my office at [phone] when you are discharged from the hospital or surgery center to schedule an appointment to be seen two weeks after surgery.  Call my  office at [phone] if you develop a fever >101.5 F, nausea, vomiting, bleeding from the surgical site or severe pain.

## 2019-01-15 NOTE — Anesthesia Procedure Notes (Signed)
Procedure Name: LMA Insertion Date/Time: 01/15/2019 7:33 AM Performed by: Jenne Campus, CRNA Pre-anesthesia Checklist: Patient identified, Emergency Drugs available, Suction available and Patient being monitored Patient Re-evaluated:Patient Re-evaluated prior to induction Oxygen Delivery Method: Circle System Utilized Preoxygenation: Pre-oxygenation with 100% oxygen Induction Type: IV induction Ventilation: Mask ventilation without difficulty LMA: LMA inserted LMA Size: 4.0 Number of attempts: 1 Placement Confirmation: positive ETCO2 and breath sounds checked- equal and bilateral Tube secured with: Tape Dental Injury: Teeth and Oropharynx as per pre-operative assessment

## 2019-01-15 NOTE — Progress Notes (Signed)
Order Clarification:  Ancef 2G IV ordered but Penicillin allergy noted. Notified Dr. Doran Durand who stated he still wants patient to have Ancef.

## 2019-01-15 NOTE — Transfer of Care (Signed)
Immediate Anesthesia Transfer of Care Note  Patient: Vanessa Russo  Procedure(s) Performed: Right total ankle replacement and Achilles tendon lengthening (Right Ankle)  Patient Location: PACU  Anesthesia Type:GA combined with regional for post-op pain  Level of Consciousness: drowsy and patient cooperative  Airway & Oxygen Therapy: Patient Spontanous Breathing and Patient connected to nasal cannula oxygen  Post-op Assessment: Report given to RN and Post -op Vital signs reviewed and stable  Post vital signs: Reviewed and stable  Last Vitals:  Vitals Value Taken Time  BP    Temp    Pulse 83 01/15/19 0944  Resp 13 01/15/19 0944  SpO2 89 % 01/15/19 0944  Vitals shown include unvalidated device data.  Last Pain:  Vitals:   01/15/19 0600  TempSrc:   PainSc: 2       Patients Stated Pain Goal: 4 (40/81/44 8185)  Complications: No apparent anesthesia complications

## 2019-01-15 NOTE — H&P (Signed)
Vanessa Russo is an 63 y.o. female.   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  SHe has failed non op treatment to date including activity modification, oral NSAIDs, bracing and PT.  She presents now for right ankle replacement.  Past Medical History:  Diagnosis Date  . Abnormal ECG during exercise stress test   . Arthritis   . History of echocardiogram   . Left bundle branch block   . PONV (postoperative nausea and vomiting)   . Pre-diabetes     Past Surgical History:  Procedure Laterality Date  . ABDOMINAL HYSTERECTOMY    . CARDIAC CATHETERIZATION    . DILATION AND CURETTAGE OF UTERUS    . FRACTURE SURGERY     Bilateral ankles  . JOINT REPLACEMENT Left    knee  . TUBAL LIGATION      History reviewed. No pertinent family history. Social History:  reports that she quit smoking about 31 years ago. Her smoking use included cigarettes. She has never used smokeless tobacco. She reports previous alcohol use. She reports that she does not use drugs.  Allergies:  Allergies  Allergen Reactions  . Penicillins Hives and Swelling    Did it involve swelling of the face/tongue/throat, SOB, or low BP? Unknown Did it involve sudden or severe rash/hives, skin peeling, or any reaction on the inside of your mouth or nose? Yes Did you need to seek medical attention at a hospital or doctor's office? No When did it last happen?Childhood reaction. If all above answers are "NO", may proceed with cephalosporin use.   . Other Nausea And Vomiting    Anesthesia-vomiting   . Sulfa Antibiotics Nausea And Vomiting  . Ciprofloxacin Nausea And Vomiting    Medications Prior to Admission  Medication Sig Dispense Refill  . atorvastatin (LIPITOR) 40 MG tablet Take 40 mg by mouth every evening.    . carvedilol (COREG) 6.25 MG tablet Take 6.25 mg by mouth 2 (two) times daily.    .  diphenhydramine-acetaminophen (TYLENOL PM) 25-500 MG TABS tablet Take 2 tablets by mouth at bedtime as needed (sleep/pain).    Marland Kitchen losartan (COZAAR) 50 MG tablet Take 50 mg by mouth every evening.    . meloxicam (MOBIC) 15 MG tablet Take 15 mg by mouth daily.    . montelukast (SINGULAIR) 10 MG tablet Take 10 mg by mouth at bedtime.    Marland Kitchen omeprazole (PRILOSEC) 20 MG capsule Take 20 mg by mouth daily before breakfast.    . Probiotic Product (PROBIOTIC PO) Take 1 capsule by mouth daily.    . traMADol (ULTRAM) 50 MG tablet Take 50 mg by mouth 2 (two) times daily as needed for pain.    Marland Kitchen triamterene-hydrochlorothiazide (DYAZIDE) 37.5-25 MG capsule Take 1 capsule by mouth daily.    . TURMERIC PO Take 1,950 mg by mouth 2 (two) times daily.      Results for orders placed or performed during the hospital encounter of 01/15/19 (from the past 48 hour(s))  Glucose, capillary     Status: None   Collection Time: 01/15/19  5:48 AM  Result Value Ref Range   Glucose-Capillary 96 70 - 99 mg/dL   No results found.  Review of Systems  No recent f/c/n/v/wt loss  Blood pressure (!) 170/81, pulse 75, temperature 97.7 F (36.5 C), temperature source Oral, resp. rate 18, height 5\' 3"  (1.6 m), weight  108.9 kg, SpO2 98 %. Physical Exam  wn wd woman in nad.  A and O x 4.  Mood and affect normal.  EOMI.  resp unlabored.  R ankle with healthy skin.  No lymphadenopathy.  5/5 strength in PF and DF of the ankle and toes.  Sens to LT intact dorsally and plantarly at the forefoot.  2+ dp and pt pulses.  Assessment/Plan R ankle post traumatic arthritis - to the OR today for right total ankle replacement.  The risks and benefits of the alternative treatment options have been discussed in detail.  The patient wishes to proceed with surgery and specifically understands risks of bleeding, infection, nerve damage, blood clots, need for additional surgery, amputation and death.   Wylene Simmer, MD 02-10-2019, 7:15 AM

## 2019-01-19 ENCOUNTER — Encounter: Payer: Self-pay | Admitting: *Deleted

## 2020-04-27 IMAGING — RF DG ANKLE COMPLETE 3+V*R*
1 series · 3 of 3 positions shown · non-contrast
Comparison: None.

CLINICAL DATA: Ankle replacement

EXAM:
RIGHT ANKLE - COMPLETE 3+ VIEW

[Series 1: unknown protocol · 0.14mm/px · 3 of 3 slices shown]
[im 1/3]
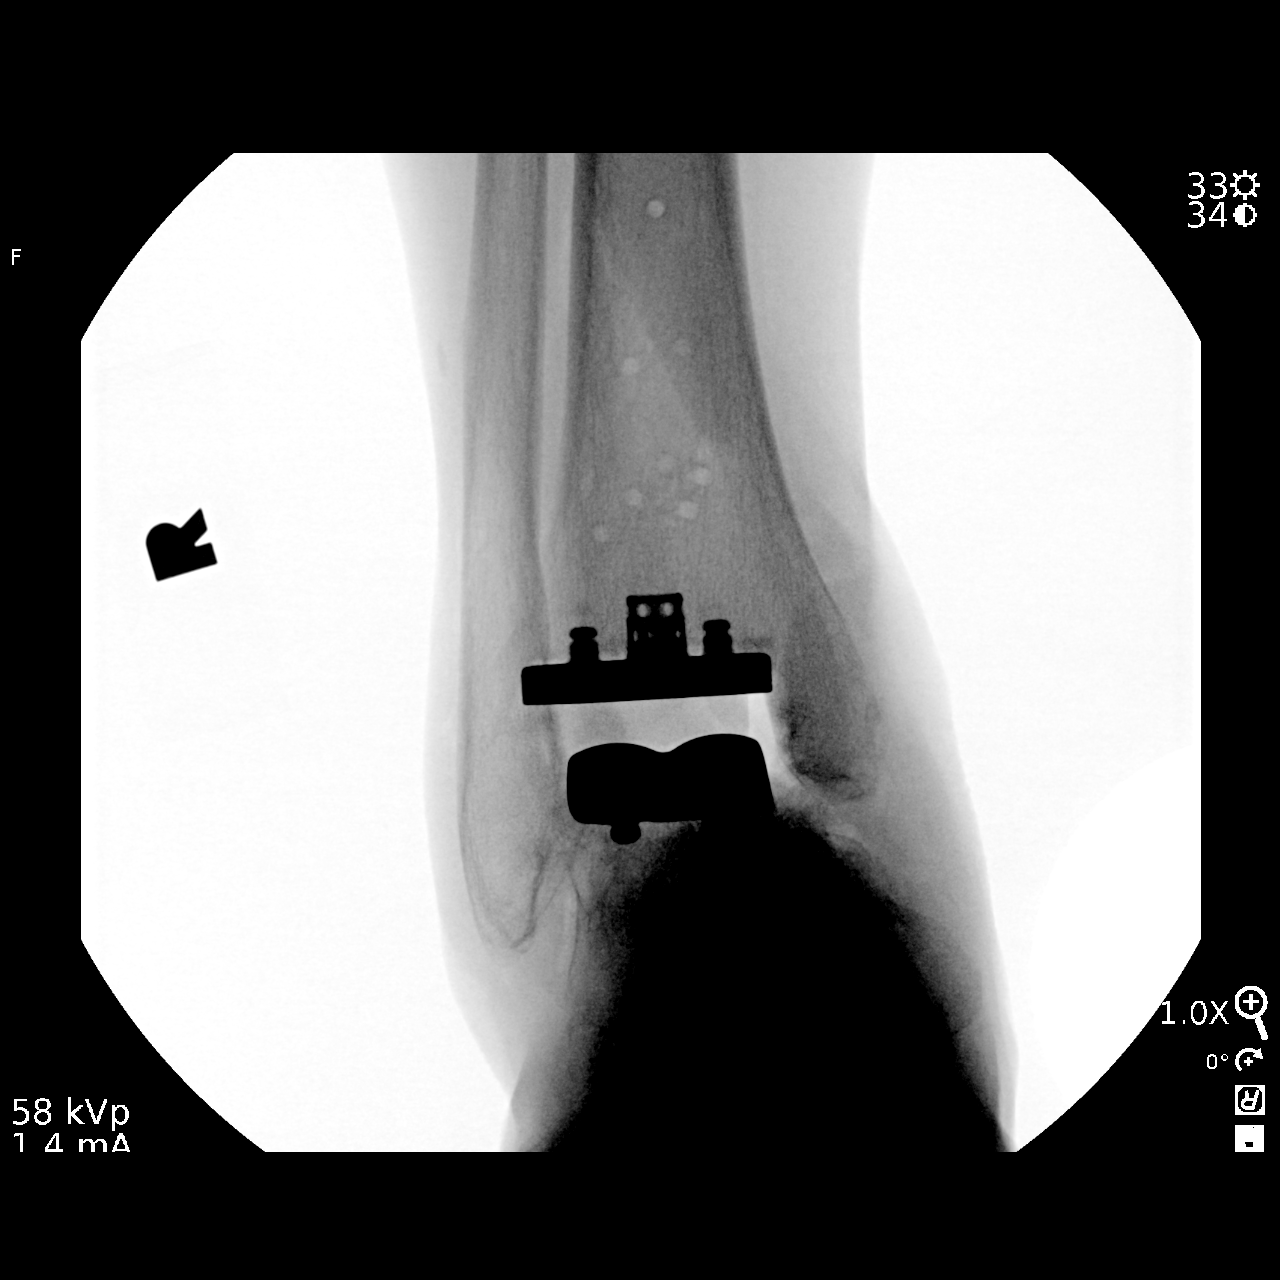
[im 2/3]
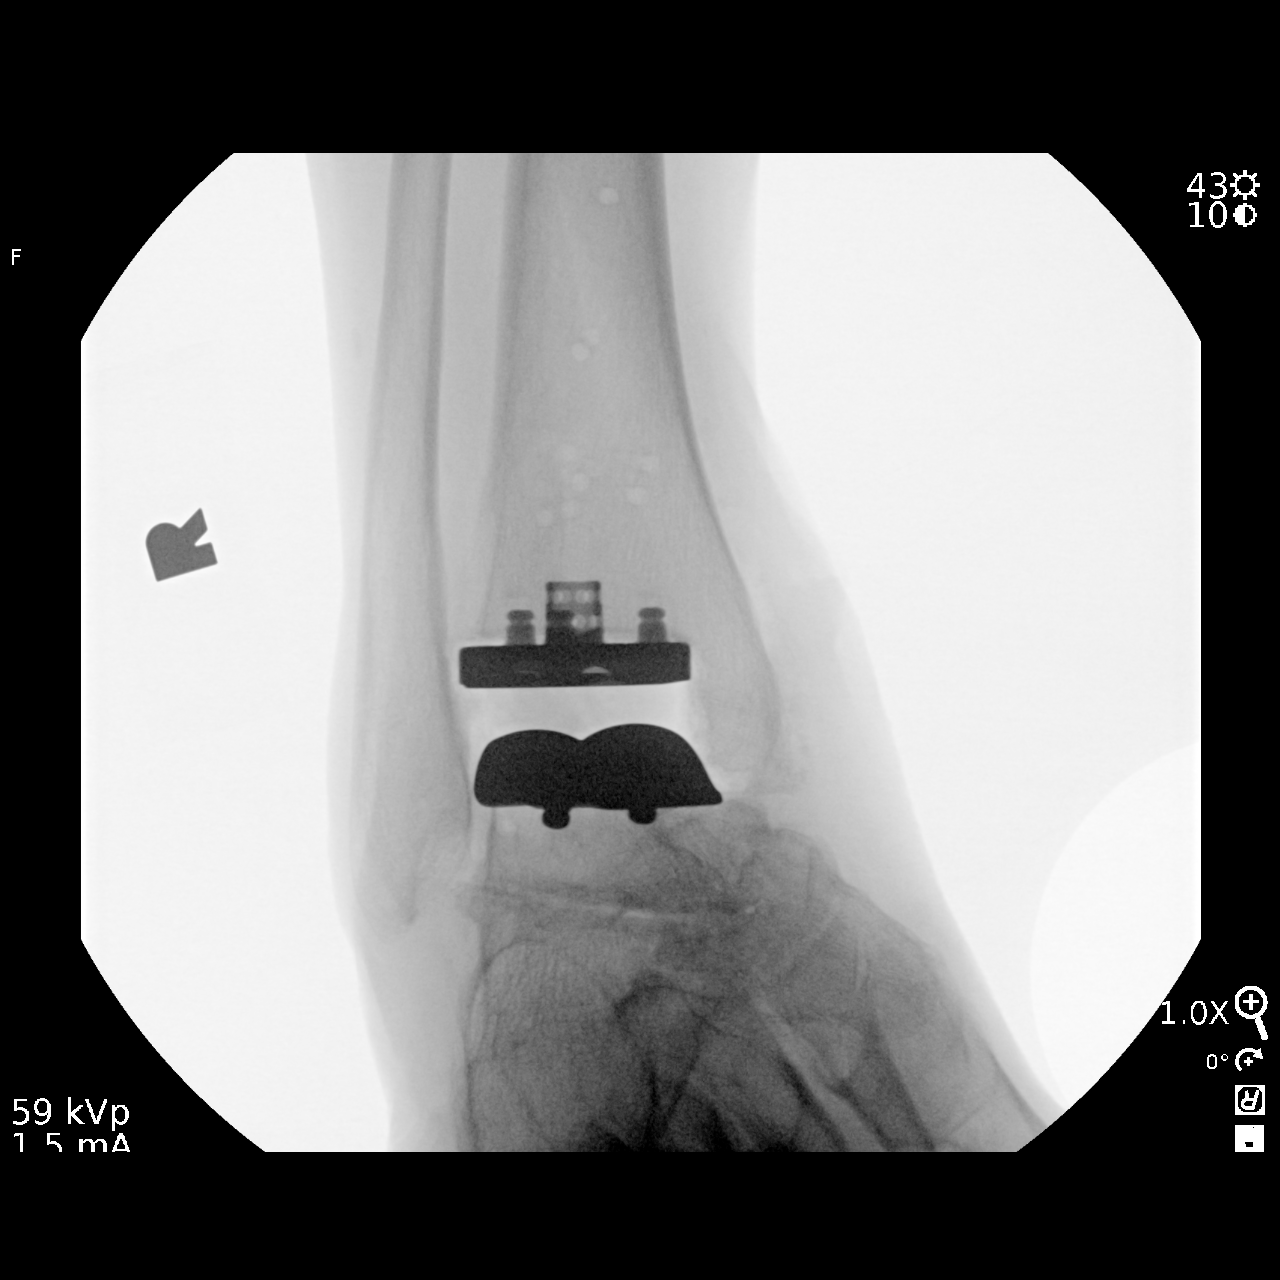
[im 3/3]
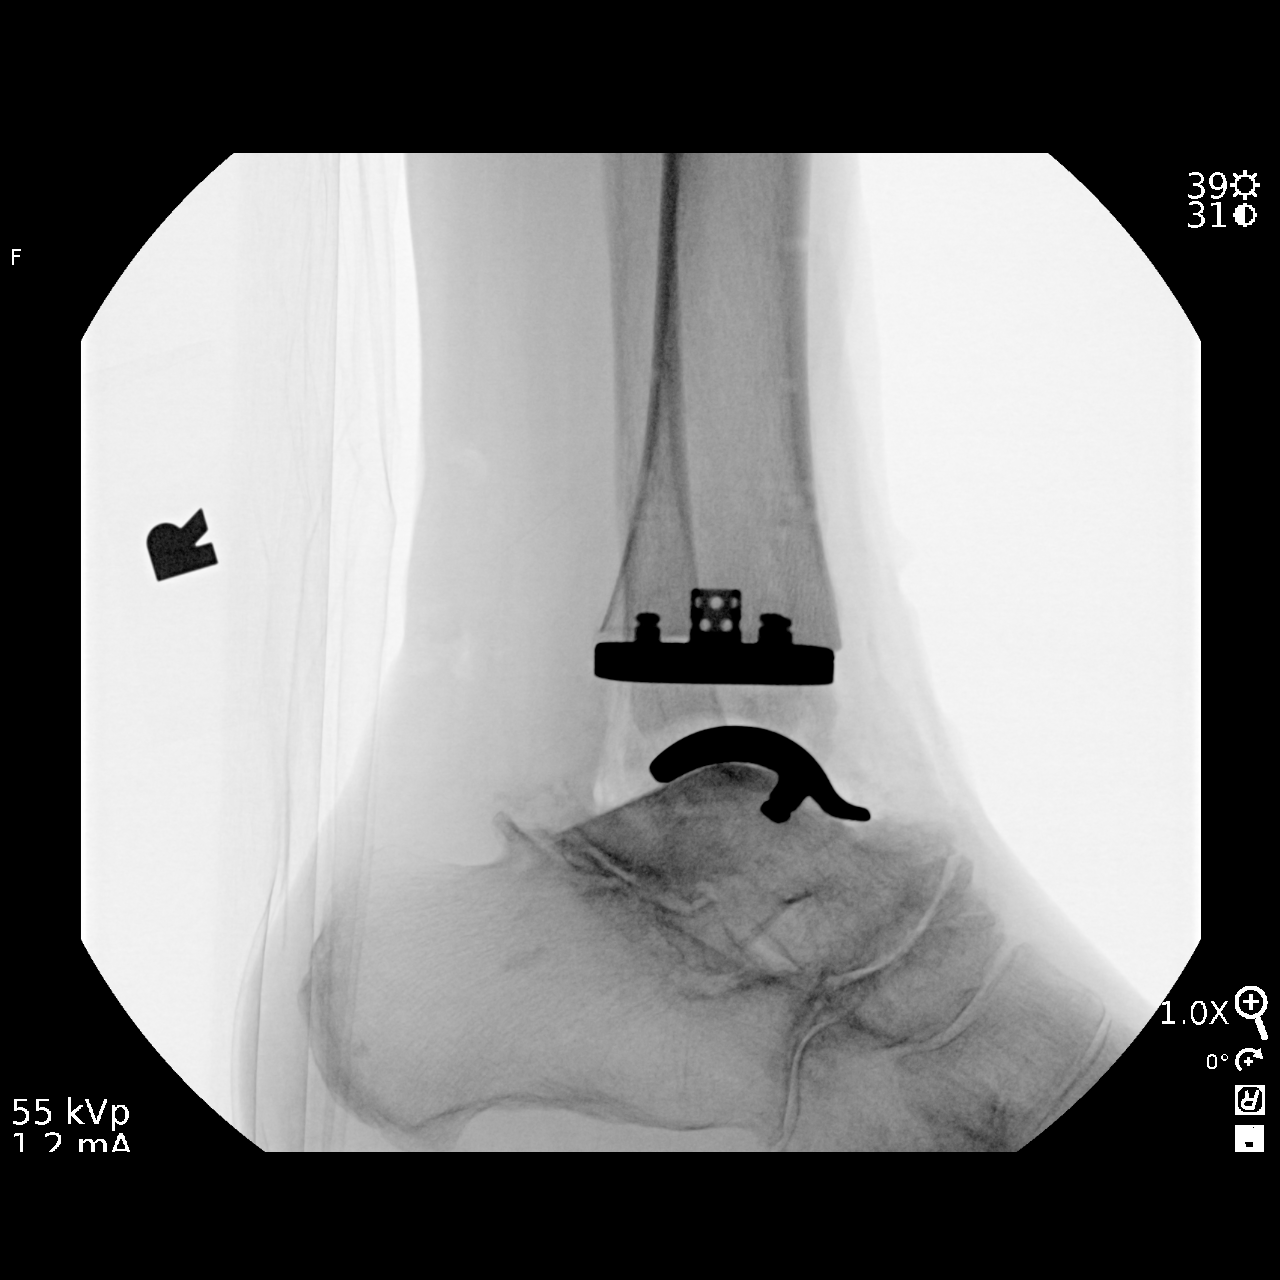

[3 of 3 positions shown; findings below may reference images not displayed]

FINDINGS: Intraoperative radiographs demonstrate right total ankle
arthroplasty at the tibiotalar joint. There are changes of
osteoarthritis.
IMPRESSION: Post right total ankle arthroplasty.
